# Patient Record
Sex: Female | Born: 1980 | Race: White | Hispanic: No | Marital: Married | Smoking: Never smoker
Health system: Southern US, Community
[De-identification: ages and names within clinical notes are randomized; demographics above are authoritative.]

## PROBLEM LIST (undated history)

## (undated) HISTORY — PX: AUGMENTATION MAMMAPLASTY: SUR837

---
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
## 1999-05-04 ENCOUNTER — Other Ambulatory Visit: Admission: RE | Admit: 1999-05-04 | Discharge: 1999-05-04 | Payer: Self-pay | Admitting: Family Medicine

## 2001-04-29 ENCOUNTER — Other Ambulatory Visit: Admission: RE | Admit: 2001-04-29 | Discharge: 2001-04-29 | Payer: Self-pay | Admitting: Obstetrics and Gynecology

## 2002-11-19 ENCOUNTER — Other Ambulatory Visit: Admission: RE | Admit: 2002-11-19 | Discharge: 2002-11-19 | Payer: Self-pay | Admitting: Obstetrics & Gynecology

## 2006-04-23 ENCOUNTER — Inpatient Hospital Stay (HOSPITAL_COMMUNITY): Admission: AD | Admit: 2006-04-23 | Discharge: 2006-04-24 | Payer: Self-pay | Admitting: Obstetrics and Gynecology

## 2006-04-24 ENCOUNTER — Encounter (INDEPENDENT_AMBULATORY_CARE_PROVIDER_SITE_OTHER): Payer: Self-pay | Admitting: *Deleted

## 2006-04-24 ENCOUNTER — Inpatient Hospital Stay (HOSPITAL_COMMUNITY): Admission: AD | Admit: 2006-04-24 | Discharge: 2006-04-24 | Payer: Self-pay | Admitting: Obstetrics and Gynecology

## 2006-04-24 ENCOUNTER — Inpatient Hospital Stay (HOSPITAL_COMMUNITY): Admission: RE | Admit: 2006-04-24 | Discharge: 2006-04-27 | Payer: Self-pay | Admitting: *Deleted

## 2006-04-29 ENCOUNTER — Inpatient Hospital Stay (HOSPITAL_COMMUNITY): Admission: AD | Admit: 2006-04-29 | Discharge: 2006-04-29 | Payer: Self-pay | Admitting: Obstetrics & Gynecology

## 2006-04-30 ENCOUNTER — Encounter: Admission: RE | Admit: 2006-04-30 | Discharge: 2006-04-30 | Payer: Self-pay | Admitting: Obstetrics and Gynecology

## 2009-09-21 ENCOUNTER — Emergency Department (HOSPITAL_COMMUNITY): Admission: EM | Admit: 2009-09-21 | Discharge: 2009-09-21 | Payer: Self-pay | Admitting: Emergency Medicine

## 2010-06-02 ENCOUNTER — Inpatient Hospital Stay (HOSPITAL_COMMUNITY): Admission: RE | Admit: 2010-06-02 | Discharge: 2010-06-05 | Payer: Self-pay | Admitting: Obstetrics and Gynecology

## 2010-06-04 ENCOUNTER — Ambulatory Visit: Payer: Self-pay | Admitting: Surgery

## 2010-06-04 ENCOUNTER — Encounter (INDEPENDENT_AMBULATORY_CARE_PROVIDER_SITE_OTHER): Payer: Self-pay | Admitting: Obstetrics and Gynecology

## 2010-10-07 ENCOUNTER — Encounter: Payer: Self-pay | Admitting: Family Medicine

## 2010-11-29 LAB — CBC
Hemoglobin: 10.4 g/dL — ABNORMAL LOW (ref 12.0–15.0)
Hemoglobin: 12.4 g/dL (ref 12.0–15.0)
MCH: 32.9 pg (ref 26.0–34.0)
MCH: 33.2 pg (ref 26.0–34.0)
MCHC: 34.4 g/dL (ref 30.0–36.0)
MCHC: 34.9 g/dL (ref 30.0–36.0)
MCV: 95.1 fL (ref 78.0–100.0)
RBC: 3.13 MIL/uL — ABNORMAL LOW (ref 3.87–5.11)
RDW: 13.5 % (ref 11.5–15.5)

## 2010-11-29 LAB — RPR: RPR Ser Ql: NONREACTIVE

## 2011-02-01 NOTE — Discharge Summary (Signed)
NAMESHANDIIN, EISENBEIS             ACCOUNT NO.:  [id number]   MEDICAL RECORD NO.:  [id number]          PATIENT TYPE:  INP   LOCATION:                                FACILITY:  [facility]   PHYSICIAN:  [place] B. Earlene Plater, M.D.  DATE OF BIRTH:  1981/03/19   DATE OF ADMISSION:  04/29/2006  DATE OF DISCHARGE:  04/29/2006                                 DISCHARGE SUMMARY   ADMISSION DIAGNOSIS:  Suspected macrosomia at 39 weeks.   DISCHARGE DIAGNOSIS:  Suspected macrosomia at 39 weeks.   PROCEDURE:  Primary low transverse cesarean section.   HISTORY OF PRESENT ILLNESS:  A 30 year old, white female, gravida 1,  estimated fetal weight of over 10 pounds on ultrasound presents for a  primary C section.   HOSPITAL COURSE:  On the day of surgery, the patient underwent primary low  transverse cesarean section, viable female, Apgar's 9 and 9, thin meconium  noted, weight 10 pounds 12 ounces with nuchal cord x1, pH was 7.24, normal  appearing uterus, tubes and ovaries.   Postpartum the patient rapidly regained the ability to ambulate, void, and  tolerate a regular diet. She was discharged home on the third postoperative  day in satisfactory condition.   DISCHARGE MEDICATIONS:  Tylox 1-2 p.o. q.4-6h p.r.n. pain.   DISCHARGE INSTRUCTIONS:  Standard  instructions give for dismissal .   CONDITION ON DISCHARGE:  Satisfactory.   FOLLOWUP:  6 weeks.      Gerri Spore B. Earlene Plater, M.D.  Electronically Signed     WBD/MEDQ  D:  06/02/2006  T:  06/03/2006  Job:  [id number]

## 2011-02-01 NOTE — Op Note (Signed)
Jodi Howard, Jodi Howard             ACCOUNT NO.:  [id number]   MEDICAL RECORD NO.:  [id number]          PATIENT TYPE:  INP   LOCATION:  9127                          FACILITY:  [facility]   PHYSICIAN:  Gerri Spore B. Earlene Plater, M.D.  DATE OF BIRTH:  Nov 16, 1980   DATE OF PROCEDURE:  04/24/2006  DATE OF DISCHARGE:                                 OPERATIVE REPORT   PREOPERATIVE DIAGNOSES:  1. Term intrauterine pregnancy.  2. Suspected macrosomia.  3. Spontaneous labor.   POSTOPERATIVE DIAGNOSES:  1. Term intrauterine pregnancy.  2. Suspected macrosomia.  3. Spontaneous labor.   PROCEDURE:  Primary low transverse cesarean section.   SURGEON:  Chester Holstein. Earlene Plater, M.D.   ASSISTANT:  Maxie Better, M.D.   ANESTHESIA:  Spinal.   SPECIMENS:  Placenta to pathology.   ESTIMATED BLOOD LOSS:  400 mL.   COMPLICATIONS:  None.   FINDINGS:  A viable female, Apgars 9 and 9.  Thin meconium.  Cord around the  neck x1.  Weight 10 pounds 12 ounces.  Cord pH 7.24.  normal-appearing  uterus, tubes, and ovaries.   INDICATIONS:  Patient with suspected macrosomia on ultrasound yesterday.  Estimated fetal weight was 10 pounds 7 ounces.  The patient was advised of  the inaccuracies of ultrasound-estimated weights, however that there was an  increased risk of macrosomia and the potential for injury and the need for a  C-section.  The patient had decided to proceed with elective C-section later  today but did present in spontaneous labor.  Therefore, her C-section was  expedited.  She was advised preoperatively of the risks of surgery including  infection, bleeding, damage to surrounding organs and potential issues for  subsequent pregnancies having one previous C-section.   PROCEDURE:  The patient was taken to the operating room and spinal  anesthesia obtained.  She was prepped and draped in standard fashion and a  Foley catheter inserted in the bladder.   A Pfannenstiel incision made, carried sharply to the  fascia.  The fascia was  divided sharply, elevated, and the posterior rectus muscles dissected away  sharply.  The posterior sheath and peritoneum were elevated and entered  sharply, bladder blade inserted, bladder flap created sharply.   A uterine incision made in a transverse fashion.  Thin meconium-stained  fluid noted at amniotomy.  The infant's head was elevated to the incision  and with the assistance of a Kiwi placed on the flexion point from the mid-  green zone, one traction was necessary to deliver the vertex, no pop-off's  encountered.  A cord around the neck x1 easily reduced, nose and mouth  suctioned with the DeLee.  The anterior shoulder, which was the left, was  delivered by flexion of the elbow and the remainder of the infant delivered  without difficulty.  The cord was clamped and cut and the infant handed off  to the awaiting pediatricians.  Ancef 1 g was given at cord clamp.   The placenta was removed by uterine massage, the uterus exteriorized and  cleared of all clots and debris.  It was free of extension.  Tubes  and  ovaries and uterus appeared normal.   The uterine incision was closed in a running locked stitch of 0 chromic.  A  second imbricating layer placed.  Hemostasis obtained.   The uterus returned to the abdomen and the pelvis irrigated.  Uterine  incision and bladder flap and subfascial space were all hemostatic.  The  fascia was closed with a running stitch of 0 Vicryl.  The subcutaneous  tissue was irrigated and made hemostatic with the Bovie and reapproximated  with a running stitch of plain 0 gut suture.  The skin was closed with  staples.   The patient tolerated the procedure well with no complications.  She was  taken to the recovery room awake, alert, and in stable condition.  All  counts were correct per the operating room staff.      Gerri Spore B. Earlene Plater, M.D.  Electronically Signed     WBD/MEDQ  D:  04/24/2006  T:  04/24/2006  Job:   [id number]

## 2011-02-27 ENCOUNTER — Other Ambulatory Visit: Payer: Self-pay | Admitting: Family Medicine

## 2011-02-27 DIAGNOSIS — R220 Localized swelling, mass and lump, head: Secondary | ICD-10-CM

## 2011-02-27 DIAGNOSIS — R221 Localized swelling, mass and lump, neck: Secondary | ICD-10-CM

## 2011-02-28 ENCOUNTER — Ambulatory Visit
Admission: RE | Admit: 2011-02-28 | Discharge: 2011-02-28 | Disposition: A | Discharge status: Home / Self Care | Payer: BC Managed Care – PPO | Source: Ambulatory Visit | Attending: Family Medicine | Admitting: Family Medicine

## 2011-02-28 DIAGNOSIS — R220 Localized swelling, mass and lump, head: Secondary | ICD-10-CM

## 2011-03-07 ENCOUNTER — Other Ambulatory Visit: Payer: Self-pay | Admitting: Endocrinology

## 2011-03-07 DIAGNOSIS — E041 Nontoxic single thyroid nodule: Secondary | ICD-10-CM

## 2011-03-12 ENCOUNTER — Other Ambulatory Visit (HOSPITAL_COMMUNITY)
Admission: RE | Admit: 2011-03-12 | Discharge: 2011-03-12 | Disposition: A | Discharge status: Home / Self Care | Payer: BC Managed Care – PPO | Source: Ambulatory Visit | Attending: Interventional Radiology | Admitting: Interventional Radiology

## 2011-03-12 ENCOUNTER — Ambulatory Visit
Admission: RE | Admit: 2011-03-12 | Discharge: 2011-03-12 | Disposition: A | Discharge status: Home / Self Care | Payer: BC Managed Care – PPO | Source: Ambulatory Visit | Attending: Endocrinology | Admitting: Endocrinology

## 2011-03-12 ENCOUNTER — Other Ambulatory Visit: Payer: Self-pay | Admitting: Interventional Radiology

## 2011-03-12 DIAGNOSIS — E041 Nontoxic single thyroid nodule: Secondary | ICD-10-CM

## 2011-03-12 DIAGNOSIS — E049 Nontoxic goiter, unspecified: Secondary | ICD-10-CM | POA: Insufficient documentation

## 2012-09-10 ENCOUNTER — Emergency Department (HOSPITAL_COMMUNITY)
Admission: EM | Admit: 2012-09-10 | Discharge: 2012-09-11 | Disposition: A | Discharge status: Home / Self Care | Payer: BC Managed Care – PPO | Attending: Emergency Medicine | Admitting: Emergency Medicine

## 2012-09-10 ENCOUNTER — Encounter (HOSPITAL_COMMUNITY): Payer: Self-pay | Admitting: *Deleted

## 2012-09-10 DIAGNOSIS — G510 Bell's palsy: Secondary | ICD-10-CM

## 2012-09-10 NOTE — ED Notes (Signed)
Informed P Dammen PA of patients total time in [facility] without being seen by MD or PA

## 2012-09-10 NOTE — ED Notes (Signed)
Pt c/o dry mouth all day; tonight felt like had had dental work; left side of mouth felt numb; minimal deficit noted to left side of face/eyebrow/mouth; no other c/o distress

## 2012-09-10 NOTE — ED Notes (Signed)
Pt sts she awoke this morning with left sided facial numbness and drooping. Pt mother concerned for bell's palsey. Pt has no other deficits other than numbness in the left side of her face, able to speak clear sentences.

## 2012-09-11 MED ORDER — VALACYCLOVIR HCL 1 G PO TABS: 1000.0000 mg | ORAL_TABLET | Freq: Three times a day (TID) | ORAL | Status: DC

## 2012-09-11 MED ORDER — PREDNISONE 10 MG PO TABS: ORAL_TABLET | ORAL | Status: DC

## 2012-09-11 MED ORDER — PREDNISONE 20 MG PO TABS
ORAL_TABLET | ORAL | Status: AC
  Filled 2012-09-11: qty 2

## 2012-09-11 MED ORDER — VALACYCLOVIR HCL 500 MG PO TABS
1000.0000 mg | ORAL_TABLET | Freq: Once | ORAL | Status: AC
  Administered 2012-09-11: 1000 mg via ORAL
  Filled 2012-09-11: qty 2

## 2012-09-11 MED ORDER — PREDNISONE 20 MG PO TABS
60.0000 mg | ORAL_TABLET | Freq: Once | ORAL | Status: AC
  Administered 2012-09-11: 60 mg via ORAL
  Filled 2012-09-11: qty 1

## 2012-09-11 NOTE — ED Provider Notes (Signed)
History     CSN: [id number]  Arrival date & time 09/10/12  2045   First MD Initiated Contact with Patient 09/10/12 2302      No chief complaint on file.   (Consider location/radiation/quality/duration/timing/severity/associated sxs/prior treatment) HPI  Patient reports that she woke up this morning she felt like it was on it was dry and irritated and she also noted she had a dry mouth. She states later during the day she noted when she was eating her left face felt numb this evening. She denies any drooling or difficulty holding fluids or food in her mouth. She denies any ear pain. She states she does feel like her taste is off and not right. She's had a dull headache in her left forehead off and on during the day. She denies any numbness or tingling in her extremities. Patient reports she's been under a lot of stress recently with Christmas and her mother-in-law staying with them.  Patient's mother reports there are at least 8 family members who have all had Bell's palsy. She reports all recovered with no long-standing deficit except for a 32 year old who had had previous strokes.  PCP Dr. Catha Gosselin  History reviewed. No pertinent past medical history.  History reviewed. No pertinent past surgical history.  No family history on file.  History  Substance Use Topics  . Smoking status: Never Smoker   . Smokeless tobacco: Not on file  . Alcohol Use: No   stay-at-home mom  OB History    Grav Para Term Preterm Abortions TAB SAB Ect Mult Living                  Review of Systems  All other systems reviewed and are negative.    Allergies  Review of patient's allergies indicates no known allergies.  Home Medications     BP 131/75  Pulse 67  Temp 97.5 F (36.4 C) (Oral)  Resp 18  SpO2 100%  Vital signs normal    Physical Exam  Nursing note and vitals reviewed. Constitutional: She is oriented to person, place, and time. She appears well-developed and  well-nourished.  Non-toxic appearance. She does not appear ill. No distress.  HENT:  Head: Normocephalic and atraumatic.  Right Ear: External ear normal.  Left Ear: External ear normal.  Nose: Nose normal. No mucosal edema or rhinorrhea.  Mouth/Throat: Oropharynx is clear and moist and mucous membranes are normal. No dental abscesses or uvula swelling.  Eyes: Conjunctivae normal and EOM are normal. Pupils are equal, round, and reactive to light.  Neck: Normal range of motion and full passive range of motion without pain. Neck supple.  Pulmonary/Chest: Effort normal and breath sounds normal. No respiratory distress. She has no rhonchi. She exhibits no crepitus.  Abdominal: Normal appearance.  Musculoskeletal: Normal range of motion. She exhibits no edema and no tenderness.       Moves all extremities well.   Neurological: She is alert and oriented to person, place, and time. She has normal strength. No cranial nerve deficit.       Patient has equal grips. Patient has mild loss of range of motion of her left eyebrow. Although she can close her left eye I can easily open her eyelid without resistance when she's trying to keep it closed. The right is normal. When the patient smiles she is noted to have a mild left facial droop.  Skin: Skin is warm, dry and intact. No rash noted. No erythema. No pallor.  Psychiatric: She  has a normal mood and affect. Her speech is normal and behavior is normal. Her mood appears not anxious.    ED Course  Procedures (including critical care time)   Medications  valACYclovir (VALTREX) tablet 1,000 mg (not administered)  predniSONE (DELTASONE) tablet 60 mg (not administered)    We have discussed trying to keep her left are moist and such as using artificial tears and Lacri-Lube in her eye at night and keeping her eye lid shut using eye patches.   1. Bell's palsy    New Prescriptions   PREDNISONE (DELTASONE) 10 MG TABLET    Take 3 po QD x 3d starting  tomorrow, then 2 po QD x 4d then 1 po QD x 4d   VALACYCLOVIR (VALTREX) 1000 MG TABLET    Take 1 tablet (1,000 mg total) by mouth 3 (three) times daily.    Plan discharge  Devoria Albe, MD, FACEP    MDM          Ward Givens, MD 09/11/12 [phone]

## 2012-09-11 NOTE — ED Notes (Signed)
Patient given discharge instructions, information, prescriptions, and diet order. Patient states that they adequately understand discharge information given and to return to ED if symptoms return or worsen.     

## 2012-09-24 ENCOUNTER — Other Ambulatory Visit: Payer: Self-pay | Admitting: Otolaryngology

## 2012-09-24 DIAGNOSIS — G510 Bell's palsy: Secondary | ICD-10-CM

## 2012-09-30 ENCOUNTER — Other Ambulatory Visit: Payer: BC Managed Care – PPO

## 2013-01-05 ENCOUNTER — Ambulatory Visit (INDEPENDENT_AMBULATORY_CARE_PROVIDER_SITE_OTHER): Payer: Self-pay | Admitting: Surgery

## 2013-02-09 ENCOUNTER — Encounter (INDEPENDENT_AMBULATORY_CARE_PROVIDER_SITE_OTHER): Payer: Self-pay | Admitting: Surgery

## 2013-02-09 ENCOUNTER — Ambulatory Visit (INDEPENDENT_AMBULATORY_CARE_PROVIDER_SITE_OTHER): Payer: BC Managed Care – PPO | Admitting: Surgery

## 2013-02-09 VITALS — BP 100/64 | HR 72 | Resp 18 | Ht 69.0 in | Wt 176.0 lb

## 2013-02-09 DIAGNOSIS — R1032 Left lower quadrant pain: Secondary | ICD-10-CM

## 2013-02-09 NOTE — Progress Notes (Signed)
NAME: Jodi Howard       DOB: 08/16/1981           DATE: 02/09/2013       ZOX:[id number]  CC:  Chief Complaint  Patient presents with  . Other    Eval ?abd. hernia    HPI: We are asked to see her by her gynecologist to evaluate for possible left lower quadrant hernia. She lost about 50 pounds well over a year ago and has noticed some asymmetry between the left and right side of her abdomen. She is really feel a bulge coming out and doesn't have any pain when she lifts but is a little unusual looking to her. She has no other GI symptoms.  Past medical history family history are all contained the electronic medical record and not placed in this note but has been reviewed EXAM: Vital signs: BP 100/64  Pulse 72  Resp 18  Ht 5\' 9"  (1.753 m)  Wt 176 lb (79.833 kg)  BMI 25.98 kg/m2  General: Patient alert, oriented, NAD  Abdomen: There is loose scan suggestive of weight loss. There are no masses and no tenderness. There is no organomegaly. In the left lower quadrant near the area where a spiculated hernia would be noted was some prominent fatty tissue. However I could not detect a definite hernia with her supine or standing or straining. IMP: I think she just has some asymmetric fat from her weight loss.  PLAN: we discussed alternatives. I think it would be better just to wait to see if symptoms develop or more of an obvious bulge occurs. a CT scan would be definitive to rule out a hernia clinically she is asymptomatic and there is not a palpable mass  Yemaya Barnier J 02/09/2013

## 2013-02-09 NOTE — Patient Instructions (Signed)
If you develop more of a bulge or pain come back and let me re-evaluate you

## 2013-11-05 ENCOUNTER — Other Ambulatory Visit: Payer: Self-pay | Admitting: Family Medicine

## 2013-11-05 DIAGNOSIS — E049 Nontoxic goiter, unspecified: Secondary | ICD-10-CM

## 2013-11-11 ENCOUNTER — Other Ambulatory Visit: Payer: BC Managed Care – PPO

## 2013-11-15 ENCOUNTER — Ambulatory Visit
Admission: RE | Admit: 2013-11-15 | Discharge: 2013-11-15 | Disposition: A | Discharge status: Home / Self Care | Payer: BC Managed Care – PPO | Source: Ambulatory Visit | Attending: Family Medicine | Admitting: Family Medicine

## 2013-11-15 DIAGNOSIS — E049 Nontoxic goiter, unspecified: Secondary | ICD-10-CM

## 2014-03-10 ENCOUNTER — Ambulatory Visit: Payer: BC Managed Care – PPO | Admitting: Family Medicine

## 2014-03-10 ENCOUNTER — Telehealth: Payer: Self-pay | Admitting: Family Medicine

## 2014-03-10 DIAGNOSIS — Z0289 Encounter for other administrative examinations: Secondary | ICD-10-CM

## 2014-03-10 NOTE — Telephone Encounter (Signed)
Patient now showed for new patient appt today.  As of right now there is no other appt scheduled with Dr. Katrinka BlazingSmith.  Please advise.  Thanks!

## 2014-03-10 NOTE — Telephone Encounter (Signed)
Noted  

## 2021-01-31 ENCOUNTER — Other Ambulatory Visit: Payer: Self-pay | Admitting: Obstetrics and Gynecology

## 2021-01-31 DIAGNOSIS — Z1231 Encounter for screening mammogram for malignant neoplasm of breast: Secondary | ICD-10-CM

## 2021-02-13 ENCOUNTER — Ambulatory Visit: Payer: Self-pay

## 2021-02-14 ENCOUNTER — Other Ambulatory Visit: Payer: Self-pay

## 2021-02-14 ENCOUNTER — Ambulatory Visit
Admission: RE | Admit: 2021-02-14 | Discharge: 2021-02-14 | Disposition: A | Discharge status: Home / Self Care | Payer: BC Managed Care – PPO | Source: Ambulatory Visit | Attending: Obstetrics and Gynecology | Admitting: Obstetrics and Gynecology

## 2021-02-14 DIAGNOSIS — Z1231 Encounter for screening mammogram for malignant neoplasm of breast: Secondary | ICD-10-CM

## 2021-04-12 ENCOUNTER — Ambulatory Visit: Payer: Self-pay

## 2021-08-11 ENCOUNTER — Emergency Department (HOSPITAL_COMMUNITY): Payer: BC Managed Care – PPO | Admitting: Certified Registered Nurse Anesthetist

## 2021-08-11 ENCOUNTER — Emergency Department (HOSPITAL_BASED_OUTPATIENT_CLINIC_OR_DEPARTMENT_OTHER): Payer: BC Managed Care – PPO

## 2021-08-11 ENCOUNTER — Encounter (HOSPITAL_BASED_OUTPATIENT_CLINIC_OR_DEPARTMENT_OTHER): Payer: Self-pay | Admitting: *Deleted

## 2021-08-11 ENCOUNTER — Other Ambulatory Visit: Payer: Self-pay

## 2021-08-11 ENCOUNTER — Encounter (HOSPITAL_COMMUNITY)
Admission: EM | Disposition: A | Discharge status: Home / Self Care | Payer: Self-pay | Source: Home / Self Care | Attending: Emergency Medicine

## 2021-08-11 ENCOUNTER — Ambulatory Visit (HOSPITAL_BASED_OUTPATIENT_CLINIC_OR_DEPARTMENT_OTHER)
Admission: EM | Admit: 2021-08-11 | Discharge: 2021-08-11 | Disposition: A | Discharge status: Home / Self Care | Payer: BC Managed Care – PPO | Attending: Emergency Medicine | Admitting: Emergency Medicine

## 2021-08-11 DIAGNOSIS — K353 Acute appendicitis with localized peritonitis, without perforation or gangrene: Secondary | ICD-10-CM

## 2021-08-11 DIAGNOSIS — K3580 Unspecified acute appendicitis: Secondary | ICD-10-CM | POA: Insufficient documentation

## 2021-08-11 DIAGNOSIS — Z20822 Contact with and (suspected) exposure to covid-19: Secondary | ICD-10-CM | POA: Diagnosis not present

## 2021-08-11 DIAGNOSIS — K3533 Acute appendicitis with perforation and localized peritonitis, with abscess: Secondary | ICD-10-CM | POA: Diagnosis present

## 2021-08-11 DIAGNOSIS — R748 Abnormal levels of other serum enzymes: Secondary | ICD-10-CM

## 2021-08-11 HISTORY — PX: LAPAROSCOPIC APPENDECTOMY: SHX408

## 2021-08-11 LAB — COMPREHENSIVE METABOLIC PANEL
ALT: 9 U/L (ref 0–44)
AST: 15 U/L (ref 15–41)
Albumin: 3.9 g/dL (ref 3.5–5.0)
Alkaline Phosphatase: 49 U/L (ref 38–126)
Anion gap: 7 (ref 5–15)
BUN: 11 mg/dL (ref 6–20)
CO2: 26 mmol/L (ref 22–32)
Calcium: 9.3 mg/dL (ref 8.9–10.3)
Chloride: 103 mmol/L (ref 98–111)
Creatinine, Ser: 0.68 mg/dL (ref 0.44–1.00)
GFR, Estimated: >60 mL/min (ref >60)
Glucose, Bld: 129 mg/dL — ABNORMAL HIGH (ref 70–99)
Potassium: 4.2 mmol/L (ref 3.5–5.1)
Sodium: 136 mmol/L (ref 135–145)
Total Bilirubin: 0.7 mg/dL (ref 0.3–1.2)
Total Protein: 5.9 g/dL — ABNORMAL LOW (ref 6.5–8.1)

## 2021-08-11 LAB — CBC WITH DIFFERENTIAL/PLATELET
Abs Immature Granulocytes: 0.02 10*3/uL (ref 0.00–0.07)
Basophils Absolute: 0 10*3/uL (ref 0.0–0.1)
Basophils Relative: 0 %
Eosinophils Absolute: 0.1 10*3/uL (ref 0.0–0.5)
Eosinophils Relative: 1 %
HCT: 37.6 % (ref 36.0–46.0)
Hemoglobin: 12.5 g/dL (ref 12.0–15.0)
Immature Granulocytes: 0 %
Lymphocytes Relative: 19 %
Lymphs Abs: 1.7 10*3/uL (ref 0.7–4.0)
MCH: 31.1 pg (ref 26.0–34.0)
MCHC: 33.2 g/dL (ref 30.0–36.0)
MCV: 93.5 fL (ref 80.0–100.0)
Monocytes Absolute: 0.7 10*3/uL (ref 0.1–1.0)
Monocytes Relative: 8 %
Neutro Abs: 6.3 10*3/uL (ref 1.7–7.7)
Neutrophils Relative %: 72 %
Platelets: 220 10*3/uL (ref 150–400)
RBC: 4.02 MIL/uL (ref 3.87–5.11)
RDW: 12.5 % (ref 11.5–15.5)
WBC: 8.8 10*3/uL (ref 4.0–10.5)
nRBC: 0 % (ref 0.0–0.2)

## 2021-08-11 LAB — URINALYSIS, ROUTINE W REFLEX MICROSCOPIC
Bilirubin Urine: NEGATIVE
Glucose, UA: NEGATIVE mg/dL
Ketones, ur: NEGATIVE mg/dL
Nitrite: NEGATIVE
Protein, ur: 30 mg/dL — AB
RBC / HPF: >50 RBC/hpf — ABNORMAL HIGH (ref 0–5)
Specific Gravity, Urine: >1.046 — ABNORMAL HIGH (ref 1.005–1.030)
WBC, UA: >50 WBC/hpf — ABNORMAL HIGH (ref 0–5)
pH: 7 (ref 5.0–8.0)

## 2021-08-11 LAB — LIPASE, BLOOD: Lipase: 337 U/L — ABNORMAL HIGH (ref 11–51)

## 2021-08-11 LAB — RESP PANEL BY RT-PCR (FLU A&B, COVID) ARPGX2
Influenza A by PCR: NEGATIVE
Influenza B by PCR: NEGATIVE
SARS Coronavirus 2 by RT PCR: NEGATIVE

## 2021-08-11 LAB — HCG, SERUM, QUALITATIVE: Preg, Serum: NEGATIVE

## 2021-08-11 SURGERY — APPENDECTOMY, LAPAROSCOPIC
Anesthesia: General

## 2021-08-11 MED ORDER — FENTANYL CITRATE PF 50 MCG/ML IJ SOSY
100.0000 ug | PREFILLED_SYRINGE | Freq: Once | INTRAMUSCULAR | Status: AC
  Administered 2021-08-11: 100 ug via INTRAVENOUS
  Filled 2021-08-11 (×2): qty 2

## 2021-08-11 MED ORDER — ROCURONIUM BROMIDE 10 MG/ML (PF) SYRINGE
PREFILLED_SYRINGE | INTRAVENOUS | Status: DC | PRN
  Administered 2021-08-11: 30 mg via INTRAVENOUS
  Administered 2021-08-11: 20 mg via INTRAVENOUS

## 2021-08-11 MED ORDER — MIDAZOLAM HCL 2 MG/2ML IJ SOLN
INTRAMUSCULAR | Status: AC
  Filled 2021-08-11: qty 2

## 2021-08-11 MED ORDER — CHLORHEXIDINE GLUCONATE 0.12 % MT SOLN
OROMUCOSAL | Status: AC
  Filled 2021-08-11: qty 15

## 2021-08-11 MED ORDER — IOHEXOL 300 MG/ML  SOLN
100.0000 mL | Freq: Once | INTRAMUSCULAR | Status: AC | PRN
  Administered 2021-08-11: 100 mL via INTRAVENOUS

## 2021-08-11 MED ORDER — MIDAZOLAM HCL 2 MG/2ML IJ SOLN
INTRAMUSCULAR | Status: DC | PRN
  Administered 2021-08-11: 2 mg via INTRAVENOUS

## 2021-08-11 MED ORDER — SODIUM CHLORIDE 0.9 % IV SOLN
2.0000 g | Freq: Once | INTRAVENOUS | Status: AC
  Administered 2021-08-11: 2 g via INTRAVENOUS
  Filled 2021-08-11: qty 20

## 2021-08-11 MED ORDER — HYDROMORPHONE HCL 1 MG/ML IJ SOLN
0.2500 mg | INTRAMUSCULAR | Status: DC | PRN
Start: 2021-08-11 — End: 2021-08-11
  Administered 2021-08-11 (×2): 0.5 mg via INTRAVENOUS

## 2021-08-11 MED ORDER — OXYCODONE HCL 5 MG PO TABS: 5.0000 mg | ORAL_TABLET | Freq: Four times a day (QID) | ORAL | 0 refills | Status: DC | PRN

## 2021-08-11 MED ORDER — LIDOCAINE 2% (20 MG/ML) 5 ML SYRINGE
INTRAMUSCULAR | Status: DC | PRN
  Administered 2021-08-11: 60 mg via INTRAVENOUS

## 2021-08-11 MED ORDER — LACTATED RINGERS IV SOLN: INTRAVENOUS | Status: DC

## 2021-08-11 MED ORDER — SCOPOLAMINE 1 MG/3DAYS TD PT72
1.0000 | MEDICATED_PATCH | TRANSDERMAL | Status: DC
  Administered 2021-08-11: 1.5 mg via TRANSDERMAL
  Filled 2021-08-11: qty 1

## 2021-08-11 MED ORDER — PROPOFOL 10 MG/ML IV BOLUS
INTRAVENOUS | Status: AC
  Filled 2021-08-11: qty 20

## 2021-08-11 MED ORDER — ACETAMINOPHEN 10 MG/ML IV SOLN
INTRAVENOUS | Status: AC
  Filled 2021-08-11: qty 100

## 2021-08-11 MED ORDER — FENTANYL CITRATE (PF) 250 MCG/5ML IJ SOLN
INTRAMUSCULAR | Status: DC | PRN
  Administered 2021-08-11 (×3): 50 ug via INTRAVENOUS

## 2021-08-11 MED ORDER — SUCCINYLCHOLINE CHLORIDE 200 MG/10ML IV SOSY
PREFILLED_SYRINGE | INTRAVENOUS | Status: DC | PRN
  Administered 2021-08-11: 100 mg via INTRAVENOUS

## 2021-08-11 MED ORDER — ONDANSETRON HCL 4 MG/2ML IJ SOLN
4.0000 mg | Freq: Once | INTRAMUSCULAR | Status: AC
  Administered 2021-08-11: 4 mg via INTRAVENOUS
  Filled 2021-08-11: qty 2

## 2021-08-11 MED ORDER — FENTANYL CITRATE (PF) 250 MCG/5ML IJ SOLN
INTRAMUSCULAR | Status: AC
  Filled 2021-08-11: qty 5

## 2021-08-11 MED ORDER — BUPIVACAINE-EPINEPHRINE 0.25% -1:200000 IJ SOLN
INTRAMUSCULAR | Status: DC | PRN
  Administered 2021-08-11: 30 mL

## 2021-08-11 MED ORDER — IBUPROFEN 800 MG PO TABS: 800.0000 mg | ORAL_TABLET | Freq: Three times a day (TID) | ORAL | 0 refills | Status: AC | PRN

## 2021-08-11 MED ORDER — BUPIVACAINE-EPINEPHRINE (PF) 0.25% -1:200000 IJ SOLN
INTRAMUSCULAR | Status: AC
  Filled 2021-08-11: qty 30

## 2021-08-11 MED ORDER — ONDANSETRON HCL 4 MG/2ML IJ SOLN
INTRAMUSCULAR | Status: DC | PRN
  Administered 2021-08-11: 4 mg via INTRAVENOUS

## 2021-08-11 MED ORDER — SODIUM CHLORIDE 0.9 % IR SOLN
Status: DC | PRN
  Administered 2021-08-11: 1000 mL

## 2021-08-11 MED ORDER — FENTANYL CITRATE PF 50 MCG/ML IJ SOSY
50.0000 ug | PREFILLED_SYRINGE | INTRAMUSCULAR | Status: DC | PRN
  Administered 2021-08-11: 50 ug via INTRAVENOUS
  Filled 2021-08-11: qty 1

## 2021-08-11 MED ORDER — HYDROMORPHONE HCL 1 MG/ML IJ SOLN
INTRAMUSCULAR | Status: AC
  Filled 2021-08-11: qty 1

## 2021-08-11 MED ORDER — CHLORHEXIDINE GLUCONATE 0.12 % MT SOLN
15.0000 mL | Freq: Once | OROMUCOSAL | Status: AC
  Administered 2021-08-11: 15 mL via OROMUCOSAL

## 2021-08-11 MED ORDER — OXYCODONE HCL 5 MG PO TABS: 5.0000 mg | ORAL_TABLET | Freq: Four times a day (QID) | ORAL | 0 refills | Status: AC | PRN

## 2021-08-11 MED ORDER — 0.9 % SODIUM CHLORIDE (POUR BTL) OPTIME
TOPICAL | Status: DC | PRN
  Administered 2021-08-11: 1000 mL

## 2021-08-11 MED ORDER — METRONIDAZOLE 500 MG/100ML IV SOLN
500.0000 mg | Freq: Once | INTRAVENOUS | Status: AC
  Administered 2021-08-11: 500 mg via INTRAVENOUS
  Filled 2021-08-11: qty 100

## 2021-08-11 MED ORDER — FENTANYL CITRATE PF 50 MCG/ML IJ SOSY
50.0000 ug | PREFILLED_SYRINGE | Freq: Once | INTRAMUSCULAR | Status: AC
  Administered 2021-08-11: 50 ug via INTRAVENOUS
  Filled 2021-08-11: qty 1

## 2021-08-11 MED ORDER — DEXAMETHASONE SODIUM PHOSPHATE 10 MG/ML IJ SOLN
INTRAMUSCULAR | Status: DC | PRN
  Administered 2021-08-11: 10 mg via INTRAVENOUS

## 2021-08-11 MED ORDER — KETOROLAC TROMETHAMINE 30 MG/ML IJ SOLN
INTRAMUSCULAR | Status: DC | PRN
  Administered 2021-08-11: 30 mg via INTRAVENOUS

## 2021-08-11 MED ORDER — SUGAMMADEX SODIUM 200 MG/2ML IV SOLN
INTRAVENOUS | Status: DC | PRN
  Administered 2021-08-11: 200 mg via INTRAVENOUS

## 2021-08-11 MED ORDER — ACETAMINOPHEN 10 MG/ML IV SOLN
INTRAVENOUS | Status: DC | PRN
  Administered 2021-08-11: 1000 mg via INTRAVENOUS

## 2021-08-11 MED ORDER — PROPOFOL 10 MG/ML IV BOLUS
INTRAVENOUS | Status: DC | PRN
  Administered 2021-08-11: 120 mg via INTRAVENOUS

## 2021-08-11 MED ORDER — SCOPOLAMINE 1 MG/3DAYS TD PT72
MEDICATED_PATCH | TRANSDERMAL | Status: AC
  Filled 2021-08-11: qty 1

## 2021-08-11 MED ORDER — IBUPROFEN 800 MG PO TABS: 800.0000 mg | ORAL_TABLET | Freq: Three times a day (TID) | ORAL | 0 refills | Status: DC | PRN

## 2021-08-11 SURGICAL SUPPLY — 43 items
APPLIER CLIP 5 13 M/L LIGAMAX5 (MISCELLANEOUS)
BAG COUNTER SPONGE SURGICOUNT (BAG) ×2 IMPLANT
BAG SURGICOUNT SPONGE COUNTING (BAG) ×1
CANISTER SUCT 3000ML PPV (MISCELLANEOUS) ×3 IMPLANT
CHLORAPREP W/TINT 26 (MISCELLANEOUS) ×3 IMPLANT
CLIP APPLIE 5 13 M/L LIGAMAX5 (MISCELLANEOUS) IMPLANT
CLIP LIGATING HEMO LOK XL GOLD (MISCELLANEOUS) ×3 IMPLANT
COVER SURGICAL LIGHT HANDLE (MISCELLANEOUS) ×3 IMPLANT
CUTTER FLEX LINEAR 45M (STAPLE) IMPLANT
DERMABOND ADVANCED (GAUZE/BANDAGES/DRESSINGS) ×2
DERMABOND ADVANCED .7 DNX12 (GAUZE/BANDAGES/DRESSINGS) ×1 IMPLANT
ELECT REM PT RETURN 9FT ADLT (ELECTROSURGICAL) ×3
ELECTRODE REM PT RTRN 9FT ADLT (ELECTROSURGICAL) ×1 IMPLANT
ENDOLOOP SUT PDS II  0 18 (SUTURE)
ENDOLOOP SUT PDS II 0 18 (SUTURE) IMPLANT
GLOVE SURG POLYISO LF SZ7 (GLOVE) ×3 IMPLANT
GLOVE SURG UNDER POLY LF SZ7 (GLOVE) ×3 IMPLANT
GOWN STRL REUS W/ TWL LRG LVL3 (GOWN DISPOSABLE) ×3 IMPLANT
GOWN STRL REUS W/TWL LRG LVL3 (GOWN DISPOSABLE) ×9
GRASPER SUT TROCAR 14GX15 (MISCELLANEOUS) ×3 IMPLANT
KIT BASIN OR (CUSTOM PROCEDURE TRAY) ×3 IMPLANT
KIT TURNOVER KIT B (KITS) ×3 IMPLANT
NEEDLE 22X1 1/2 (OR ONLY) (NEEDLE) ×3 IMPLANT
NS IRRIG 1000ML POUR BTL (IV SOLUTION) ×3 IMPLANT
PAD ARMBOARD 7.5X6 YLW CONV (MISCELLANEOUS) ×6 IMPLANT
POUCH RETRIEVAL ECOSAC 10 (ENDOMECHANICALS) ×1 IMPLANT
POUCH RETRIEVAL ECOSAC 10MM (ENDOMECHANICALS) ×3
RELOAD STAPLE TA45 3.5 REG BLU (ENDOMECHANICALS) IMPLANT
SCISSORS LAP 5X35 DISP (ENDOMECHANICALS) ×3 IMPLANT
SET IRRIG TUBING LAPAROSCOPIC (IRRIGATION / IRRIGATOR) ×3 IMPLANT
SET TUBE SMOKE EVAC HIGH FLOW (TUBING) ×3 IMPLANT
SLEEVE ENDOPATH XCEL 5M (ENDOMECHANICALS) ×3 IMPLANT
SOL ANTI FOG 6CC (MISCELLANEOUS) ×1 IMPLANT
SOLUTION ANTI FOG 6CC (MISCELLANEOUS) ×2
SPECIMEN JAR SMALL (MISCELLANEOUS) ×3 IMPLANT
SUT MNCRL AB 4-0 PS2 18 (SUTURE) ×3 IMPLANT
TOWEL GREEN STERILE (TOWEL DISPOSABLE) ×3 IMPLANT
TOWEL GREEN STERILE FF (TOWEL DISPOSABLE) ×3 IMPLANT
TRAY FOLEY W/BAG SLVR 14FR (SET/KITS/TRAYS/PACK) IMPLANT
TRAY LAPAROSCOPIC MC (CUSTOM PROCEDURE TRAY) ×3 IMPLANT
TROCAR XCEL 12X100 BLDLESS (ENDOMECHANICALS) ×3 IMPLANT
TROCAR XCEL NON-BLD 5MMX100MML (ENDOMECHANICALS) ×3 IMPLANT
WATER STERILE IRR 1000ML POUR (IV SOLUTION) ×3 IMPLANT

## 2021-08-11 NOTE — Transfer of Care (Signed)
Immediate Anesthesia Transfer of Care Note  Patient: Jodi Howard  Procedure(s) Performed: APPENDECTOMY LAPAROSCOPIC  Patient Location: PACU  Anesthesia Type:General  Level of Consciousness: drowsy  Airway & Oxygen Therapy: Patient Spontanous Breathing  Post-op Assessment: Report given to RN and Post -op Vital signs reviewed and stable  Post vital signs: Reviewed and stable  Last Vitals:  Vitals Value Taken Time  BP 130/65 08/11/21 1054  Temp    Pulse 78 08/11/21 1054  Resp 26 08/11/21 1054  SpO2 97 % 08/11/21 1054  Vitals shown include unvalidated device data.  Last Pain:  Vitals:   08/11/21 0911  TempSrc: Oral  PainSc:          Complications: No notable events documented.

## 2021-08-11 NOTE — ED Provider Notes (Signed)
Care assumed from Dr. Read Drivers following transfer from [facility] for acute appendicitis.  See his note for full HPI.  Patient is a 40 year old female who presents to the ED due to right lower quadrant abdominal pain that started yesterday around 8 PM.  Patient had CT scan at droppage and found to have acute appendicitis.  General surgery was consulted and recommended transfer to [facility].  IV antibiotics given prior to transport.  Patient states her pain is roughly a 5/10.  Last ate around 11:30 PM a chocolate strawberry.  Last full meal was around 8 PM yesterday. Physical Exam  BP 119/69   Pulse (!) 52   Temp 98.6 F (37 C) (Oral)   Resp 16   Ht 5\' 8"  (1.727 m)   Wt 73 kg   LMP 07/21/2021   SpO2 98%   BMI 24.48 kg/m   Physical Exam Vitals and nursing note reviewed.  Constitutional:      General: She is not in acute distress.    Appearance: She is not ill-appearing.  HENT:     Head: Normocephalic.  Eyes:     Pupils: Pupils are equal, round, and reactive to light.  Cardiovascular:     Rate and Rhythm: Normal rate and regular rhythm.     Pulses: Normal pulses.     Heart sounds: Normal heart sounds. No murmur heard.   No friction rub. No gallop.  Pulmonary:     Effort: Pulmonary effort is normal.     Breath sounds: Normal breath sounds.  Abdominal:     General: Abdomen is flat. There is no distension.     Palpations: Abdomen is soft.     Tenderness: There is abdominal tenderness. There is guarding. There is no rebound.     Comments: +tenderness in RLQ  Musculoskeletal:        General: Normal range of motion.     Cervical back: Neck supple.  Skin:    General: Skin is warm and dry.  Neurological:     General: No focal deficit present.     Mental Status: She is alert.  Psychiatric:        Mood and Affect: Mood normal.        Behavior: Behavior normal.    ED Course/Procedures    Results for orders placed or performed during the hospital encounter of 08/11/21 (from the  past 24 hour(s))  Lipase, blood     Status: Abnormal   Collection Time: 08/11/21  3:43 AM  Result Value Ref Range   Lipase 337 (H) 11 - 51 U/L  Comprehensive metabolic panel     Status: Abnormal   Collection Time: 08/11/21  3:43 AM  Result Value Ref Range   Sodium 136 135 - 145 mmol/L   Potassium 4.2 3.5 - 5.1 mmol/L   Chloride 103 98 - 111 mmol/L   CO2 26 22 - 32 mmol/L   Glucose, Bld 129 (H) 70 - 99 mg/dL   BUN 11 6 - 20 mg/dL   Creatinine, Ser 08/13/21 0.44 - 1.00 mg/dL   Calcium 9.3 8.9 - 3.33 mg/dL   Total Protein 5.9 (L) 6.5 - 8.1 g/dL   Albumin 3.9 3.5 - 5.0 g/dL   AST 15 15 - 41 U/L   ALT 9 0 - 44 U/L   Alkaline Phosphatase 49 38 - 126 U/L   Total Bilirubin 0.7 0.3 - 1.2 mg/dL   GFR, Estimated 83.2 >91 mL/min   Anion gap 7 5 - 15  CBC with Differential/Platelet     Status: None   Collection Time: 08/11/21  3:43 AM  Result Value Ref Range   WBC 8.8 4.0 - 10.5 K/uL   RBC 4.02 3.87 - 5.11 MIL/uL   Hemoglobin 12.5 12.0 - 15.0 g/dL   HCT 27.7 82.4 - 23.5 %   MCV 93.5 80.0 - 100.0 fL   MCH 31.1 26.0 - 34.0 pg   MCHC 33.2 30.0 - 36.0 g/dL   RDW 36.1 44.3 - 15.4 %   Platelets 220 150 - 400 K/uL   nRBC 0.0 0.0 - 0.2 %   Neutrophils Relative % 72 %   Neutro Abs 6.3 1.7 - 7.7 K/uL   Lymphocytes Relative 19 %   Lymphs Abs 1.7 0.7 - 4.0 K/uL   Monocytes Relative 8 %   Monocytes Absolute 0.7 0.1 - 1.0 K/uL   Eosinophils Relative 1 %   Eosinophils Absolute 0.1 0.0 - 0.5 K/uL   Basophils Relative 0 %   Basophils Absolute 0.0 0.0 - 0.1 K/uL   Immature Granulocytes 0 %   Abs Immature Granulocytes 0.02 0.00 - 0.07 K/uL  hCG, serum, qualitative     Status: None   Collection Time: 08/11/21  4:21 AM  Result Value Ref Range   Preg, Serum NEGATIVE NEGATIVE  Resp Panel by RT-PCR (Flu A&B, Covid) Nasopharyngeal Swab     Status: None   Collection Time: 08/11/21  5:51 AM   Specimen: Nasopharyngeal Swab; Nasopharyngeal(NP) swabs in vial transport medium  Result Value Ref Range   SARS  Coronavirus 2 by RT PCR NEGATIVE NEGATIVE   Influenza A by PCR NEGATIVE NEGATIVE   Influenza B by PCR NEGATIVE NEGATIVE  Urinalysis, Routine w reflex microscopic     Status: Abnormal   Collection Time: 08/11/21  6:00 AM  Result Value Ref Range   Color, Urine ORANGE (A) YELLOW   APPearance CLEAR CLEAR   Specific Gravity, Urine >1.046 (H) 1.005 - 1.030   pH 7.0 5.0 - 8.0   Glucose, UA NEGATIVE NEGATIVE mg/dL   Hgb urine dipstick LARGE (A) NEGATIVE   Bilirubin Urine NEGATIVE NEGATIVE   Ketones, ur NEGATIVE NEGATIVE mg/dL   Protein, ur 30 (A) NEGATIVE mg/dL   Nitrite NEGATIVE NEGATIVE   Leukocytes,Ua TRACE (A) NEGATIVE   RBC / HPF >50 (H) 0 - 5 RBC/hpf   WBC, UA >50 (H) 0 - 5 WBC/hpf   Bacteria, UA RARE (A) NONE SEEN   Squamous Epithelial / LPF 0-5 0 - 5   Mucus PRESENT     Procedures  MDM  40 year old female presents to the ED due to acute appendicitis from [facility] emergency room.  Patient already given IV antibiotics prior to transport.   Patient taken to OR by general surgery with Dr. Sheliah Hatch.      Mannie Stabile, PA-C 08/11/21 0086    Milagros Loll, MD 08/11/21 1048

## 2021-08-11 NOTE — ED Notes (Signed)
Patient requesting medications for nausea and pain.  PA made aware.

## 2021-08-11 NOTE — Anesthesia Preprocedure Evaluation (Addendum)
Anesthesia Evaluation  Patient identified by MRN, date of birth, ID band Patient awake    Reviewed: Allergy & Precautions, H&P , NPO status , Patient's Chart, lab work & pertinent test results  Airway Mallampati: II  TM Distance: >3 FB Neck ROM: Full    Dental no notable dental hx. (+) Teeth Intact, Dental Advisory Given   Pulmonary neg pulmonary ROS,    Pulmonary exam normal breath sounds clear to auscultation       Cardiovascular negative cardio ROS   Rhythm:Regular Rate:Normal     Neuro/Psych negative neurological ROS  negative psych ROS   GI/Hepatic negative GI ROS, Neg liver ROS,   Endo/Other  negative endocrine ROS  Renal/GU negative Renal ROS  negative genitourinary   Musculoskeletal   Abdominal   Peds  Hematology negative hematology ROS (+)   Anesthesia Other Findings   Reproductive/Obstetrics negative OB ROS                            Anesthesia Physical Anesthesia Plan  ASA: 1  Anesthesia Plan: General   Post-op Pain Management: Ofirmev IV (intra-op)   Induction: Intravenous, Rapid sequence and Cricoid pressure planned  PONV Risk Score and Plan: 4 or greater and Ondansetron, Dexamethasone, Midazolam and Scopolamine patch - Pre-op  Airway Management Planned: Oral ETT  Additional Equipment:   Intra-op Plan:   Post-operative Plan: Extubation in OR  Informed Consent: I have reviewed the patients History and Physical, chart, labs and discussed the procedure including the risks, benefits and alternatives for the proposed anesthesia with the patient or authorized representative who has indicated his/her understanding and acceptance.     Dental advisory given  Plan Discussed with: CRNA  Anesthesia Plan Comments:        Anesthesia Quick Evaluation

## 2021-08-11 NOTE — Op Note (Signed)
Preoperative diagnosis: acute appendicitis with peritonitis  Postoperative diagnosis: Same   Procedure: laparoscopic appendectomy  Surgeon: Feliciana Rossetti, M.D.  Asst: none  Anesthesia: Gen.   Indications for procedure: Jodi Howard is a 40 y.o. female with symptoms of pain in right lower quadrant consistent with acute appendicitis. Confirmed by CT.  Description of procedure: The patient was brought into the operative suite, placed supine. Anesthesia was administered with endotracheal tube. The patient's left arm was tucked. All pressure points were offloaded by foam padding. The patient was prepped and draped in the usual sterile fashion.  A transverse incision was made to the left of the umbilicus and a 43mm trocar was [place]. Pneumoperitoneum was applied with high flow low pressure.  2 35mm trocars were placed, one in the suprapubic space, one in the LLQ, the periumbilical incision was then up-sized and a 9mm trocar placed in that space. A transversus abdominal block was placed on the left and right sides. Next, the patient was placed in trendelenberg, rotated to the left. The omentum was retracted cephalad. The cecum and appendix were identified. The appendix was dilated and inflamed without perforation. The base of the appendix was dissected and a window through the mesoappendix was created with blunt dissection. Large Hem-o-lock clips were used to doubly ligate the base of the appendix and mesoappendix. The appendix was cut free with scissors.  The appendix was placed in a specimen bag. The pelvis and RLQ were irrigated. No purulence was seen in either location The appendix was removed via the 12 mm trocar. 0 vicryl was used to close the fascial defect. Pneumoperitoneum was removed, all trocars were removed. All incisions were closed with 4-0 monocryl subcuticular stitch. The patient woke from anesthesia and was brought to PACU in stable condition.  Findings: acute appendicitis without  perforation  Specimen: appendix  Blood loss: 20 ml  Local anesthesia: 30 ml Marcaine  Complications: none  Images:      Feliciana Rossetti, M.D. General, Bariatric, & Minimally Invasive Surgery [facility]

## 2021-08-11 NOTE — ED Notes (Signed)
Patient arrived from [facility] at this time via [facility]

## 2021-08-11 NOTE — Anesthesia Postprocedure Evaluation (Signed)
Anesthesia Post Note  Patient: Jodi Howard  Procedure(s) Performed: APPENDECTOMY LAPAROSCOPIC     Patient location during evaluation: PACU Anesthesia Type: General Level of consciousness: awake and alert Pain management: pain level controlled Vital Signs Assessment: post-procedure vital signs reviewed and stable Respiratory status: spontaneous breathing, nonlabored ventilation and respiratory function stable Cardiovascular status: blood pressure returned to baseline and stable Postop Assessment: no apparent nausea or vomiting Anesthetic complications: no   No notable events documented.  Last Vitals:  Vitals:   08/11/21 1155 08/11/21 1208  BP: 112/68 103/66  Pulse: (!) 55 (!) 55  Resp: 16 15  Temp:  37.1 C  SpO2: 93% 93%    Last Pain:  Vitals:   08/11/21 1155  TempSrc:   PainSc: 6                  Khloe Hunkele,W. EDMOND

## 2021-08-11 NOTE — Anesthesia Procedure Notes (Signed)
Procedure Name: Intubation Date/Time: 08/11/2021 9:57 AM Performed by: Clearnce Sorrel, CRNA Pre-anesthesia Checklist: Patient identified, Emergency Drugs available, Suction available and Patient being monitored Patient Re-evaluated:Patient Re-evaluated prior to induction Oxygen Delivery Method: Circle System Utilized Preoxygenation: Pre-oxygenation with 100% oxygen Induction Type: IV induction, Rapid sequence and Cricoid Pressure applied Laryngoscope Size: Mac and 3 Grade View: Grade I Tube type: Oral Number of attempts: 1 Airway Equipment and Method: Stylet and Oral airway Placement Confirmation: ETT inserted through vocal cords under direct vision, positive ETCO2 and breath sounds checked- equal and bilateral Secured at: 23 cm Tube secured with: Tape Dental Injury: Teeth and Oropharynx as per pre-operative assessment

## 2021-08-11 NOTE — H&P (Signed)
Reason for Consult:abdominal pain Referring Provider: Luanna Howard Jodi Howard is an 40 y.o. female.  HPI: 40 yo female with   History reviewed. No pertinent past medical history.  Past Surgical History:  Procedure Laterality Date   AUGMENTATION MAMMAPLASTY     CESAREAN SECTION  04/24/2006   CESAREAN SECTION  06/02/2010    Family History  Problem Relation Age of Onset   Breast cancer Neg Hx     Social History:  reports that she has never smoked. She does not have any smokeless tobacco history on file. She reports current alcohol use. No history on file for drug use.  Allergies: No Known Allergies  Medications: I have reviewed the patient's current medications.  Results for orders placed or performed during the hospital encounter of 08/11/21 (from the past 48 hour(s))  Lipase, blood     Status: Abnormal   Collection Time: 08/11/21  3:43 AM  Result Value Ref Range   Lipase 337 (H) 11 - 51 U/L    Comment: Performed at [facility], [address], [place], [place] [address]  Comprehensive metabolic panel     Status: Abnormal   Collection Time: 08/11/21  3:43 AM  Result Value Ref Range   Sodium 136 135 - 145 mmol/L   Potassium 4.2 3.5 - 5.1 mmol/L   Chloride 103 98 - 111 mmol/L   CO2 26 22 - 32 mmol/L   Glucose, Bld 129 (H) 70 - 99 mg/dL    Comment: Glucose reference range applies only to samples taken after fasting for at least 8 hours.   BUN 11 6 - 20 mg/dL   Creatinine, Ser 2.75 0.44 - 1.00 mg/dL   Calcium 9.3 8.9 - 17.0 mg/dL   Total Protein 5.9 (L) 6.5 - 8.1 g/dL   Albumin 3.9 3.5 - 5.0 g/dL   AST 15 15 - 41 U/L   ALT 9 0 - 44 U/L   Alkaline Phosphatase 49 38 - 126 U/L   Total Bilirubin 0.7 0.3 - 1.2 mg/dL   GFR, Estimated >01 >74 mL/min    Comment: (NOTE) Calculated using the CKD-EPI Creatinine Equation (2021)    Anion gap 7 5 - 15    Comment: Performed at [facility], [address] [place], [place] [address]   CBC with Differential/Platelet     Status: None   Collection Time: 08/11/21  3:43 AM  Result Value Ref Range   WBC 8.8 4.0 - 10.5 K/uL   RBC 4.02 3.87 - 5.11 MIL/uL   Hemoglobin 12.5 12.0 - 15.0 g/dL   HCT 75.9 16.3 - 84.6 %   MCV 93.5 80.0 - 100.0 fL   MCH 31.1 26.0 - 34.0 pg   MCHC 33.2 30.0 - 36.0 g/dL   RDW 65.9 93.5 - 70.1 %   Platelets 220 150 - 400 K/uL   nRBC 0.0 0.0 - 0.2 %   Neutrophils Relative % 72 %   Neutro Abs 6.3 1.7 - 7.7 K/uL   Lymphocytes Relative 19 %   Lymphs Abs 1.7 0.7 - 4.0 K/uL   Monocytes Relative 8 %   Monocytes Absolute 0.7 0.1 - 1.0 K/uL   Eosinophils Relative 1 %   Eosinophils Absolute 0.1 0.0 - 0.5 K/uL   Basophils Relative 0 %   Basophils Absolute 0.0 0.0 - 0.1 K/uL   Immature Granulocytes 0 %   Abs Immature Granulocytes 0.02 0.00 - 0.07 K/uL    Comment: Performed at [facility], [address], [place], [place] [address]  hCG, serum, qualitative     Status: None   Collection Time: 08/11/21  4:21 AM  Result Value Ref Range   Preg, Serum NEGATIVE NEGATIVE    Comment:        THE SENSITIVITY OF THIS METHODOLOGY IS >10 mIU/mL. Performed at [facility], [address] [place], [place] [address]   Resp Panel by RT-PCR (Flu A&B, Covid) Nasopharyngeal Swab     Status: None   Collection Time: 08/11/21  5:51 AM   Specimen: Nasopharyngeal Swab; Nasopharyngeal(NP) swabs in vial transport medium  Result Value Ref Range   SARS Coronavirus 2 by RT PCR NEGATIVE NEGATIVE    Comment: (NOTE) SARS-CoV-2 target nucleic acids are NOT DETECTED.  The SARS-CoV-2 RNA is generally detectable in upper respiratory specimens during the acute phase of infection. The lowest concentration of SARS-CoV-2 viral copies this assay can detect is 138 copies/mL. A negative result does not preclude SARS-Cov-2 infection and should not be used as the sole basis for treatment or other patient management decisions. A negative result may  occur with  improper specimen collection/handling, submission of specimen other than nasopharyngeal swab, presence of viral mutation(s) within the areas targeted by this assay, and inadequate number of viral copies(<138 copies/mL). A negative result must be combined with clinical observations, patient history, and epidemiological information. The expected result is Negative.  Fact Sheet for Patients:  BloggerCourse.com  Fact Sheet for Healthcare Providers:  SeriousBroker.it  This test is no t yet approved or cleared by the [place] FDA and  has been authorized for detection and/or diagnosis of SARS-CoV-2 by FDA under an Emergency Use Authorization (EUA). This EUA will remain  in effect (meaning this test can be used) for the duration of the COVID-19 declaration under Section 564(b)(1) of the Act, 21 U.S.C.section 360bbb-3(b)(1), unless the authorization is terminated  or revoked sooner.       Influenza A by PCR NEGATIVE NEGATIVE   Influenza B by PCR NEGATIVE NEGATIVE    Comment: (NOTE) The Xpert Xpress SARS-CoV-2/FLU/RSV plus assay is intended as an aid in the diagnosis of influenza from Nasopharyngeal swab specimens and should not be used as a sole basis for treatment. Nasal washings and aspirates are unacceptable for Xpert Xpress SARS-CoV-2/FLU/RSV testing.  Fact Sheet for Patients: BloggerCourse.com  Fact Sheet for Healthcare Providers: SeriousBroker.it  This test is not yet approved or cleared by the [place] FDA and has been authorized for detection and/or diagnosis of SARS-CoV-2 by FDA under an Emergency Use Authorization (EUA). This EUA will remain in effect (meaning this test can be used) for the duration of the COVID-19 declaration under Section 564(b)(1) of the Act, 21 U.S.C. section 360bbb-3(b)(1), unless the authorization is terminated  or revoked.  Performed at [facility], [address] [place], [place] [address]   Urinalysis, Routine w reflex microscopic     Status: Abnormal   Collection Time: 08/11/21  6:00 AM  Result Value Ref Range   Color, Urine ORANGE (A) YELLOW    Comment: BIOCHEMICALS MAY BE AFFECTED BY COLOR   APPearance CLEAR CLEAR   Specific Gravity, Urine >1.046 (H) 1.005 - 1.030   pH 7.0 5.0 - 8.0   Glucose, UA NEGATIVE NEGATIVE mg/dL   Hgb urine dipstick LARGE (A) NEGATIVE   Bilirubin Urine NEGATIVE NEGATIVE   Ketones, ur NEGATIVE NEGATIVE mg/dL   Protein, ur 30 (A) NEGATIVE mg/dL   Nitrite NEGATIVE NEGATIVE   Leukocytes,Ua TRACE (A)  NEGATIVE   RBC / HPF >50 (H) 0 - 5 RBC/hpf   WBC, UA >50 (H) 0 - 5 WBC/hpf   Bacteria, UA RARE (A) NONE SEEN   Squamous Epithelial / LPF 0-5 0 - 5   Mucus PRESENT     Comment: Performed at [facility], [address], [place], [place] [address]    CT ABDOMEN PELVIS W CONTRAST  Result Date: 08/11/2021 CLINICAL DATA:  Right lower quadrant abdominal pain EXAM: CT ABDOMEN AND PELVIS WITH CONTRAST TECHNIQUE: Multidetector CT imaging of the abdomen and pelvis was performed using the standard protocol following bolus administration of intravenous contrast. CONTRAST:  OMNIPAQUE IOHEXOL 300 MG/ML  SOLN COMPARISON:  None. FINDINGS: Lower chest:  Mild atelectasis. Hepatobiliary: No focal liver abnormality.No evidence of biliary obstruction or stone. Pancreas: Unremarkable. Spleen: Unremarkable. Adrenals/Urinary Tract: Negative adrenals. No hydronephrosis or stone. Unremarkable bladder. Stomach/Bowel: Typical positioning of the appendix in the right lower quadrant below the cecum with luminal distension and mesenteric fat edema. Outer wall diameter is 11 mm. Two appendicoliths are present. No perforation or abscess. Stool distended ascending colon but no generalized constipated appearance. Vascular/Lymphatic: No acute vascular  abnormality. No mass or adenopathy. Reproductive:No pathologic findings. Other: Trace pelvic fluid considered reactive.  No loculated fluid. Musculoskeletal: No acute abnormalities. IMPRESSION: Acute appendicitis without perforation.  Appendicoliths are present. Electronically Signed   By: Tiburcio Pea M.D.   On: 08/11/2021 05:46    Review of Systems  Constitutional: Negative.   HENT: Negative.    Eyes: Negative.   Respiratory: Negative.    Cardiovascular: Negative.   Gastrointestinal:  Positive for abdominal pain and nausea.  Genitourinary: Negative.   Musculoskeletal: Negative.   Skin: Negative.   Neurological: Negative.   Endo/Heme/Allergies: Negative.   Psychiatric/Behavioral: Negative.     PE Blood pressure 109/66, pulse (!) 56, temperature 98.3 F (36.8 C), temperature source Oral, resp. rate 18, height 5\' 8"  (1.727 m), weight 73 kg, last menstrual period 07/21/2021, SpO2 97 %. Constitutional: NAD; conversant; no deformities Eyes: Moist conjunctiva; no lid lag; anicteric; PERRL Neck: Trachea midline; no thyromegaly Lungs: Normal respiratory effort; no tactile fremitus CV: RRR; no palpable thrills; no pitting edema GI: Abd tenderness to palpation RLQ; no palpable hepatosplenomegaly MSK: Normal gait; no clubbing/cyanosis Psychiatric: Appropriate affect; alert and oriented x3 Lymphatic: No palpable cervical or axillary lymphadenopathy Skin: No major subcutaneous nodules. Warm and dry   Assessment/Plan: 40 yo female with acute appendicitis -IV abx -plan for OR today -we discussed the details of the procedure; that it would be done under general anesthesia, that we would attempt to do the procedure laparoscopically. That the appendix would be isolated from the large and small intestine and then ligated and removed. We discussed the reason for this is to avoid rupture and infection and resolve the pains. We discussed risks of infection, abscess, injury to intestines or urinary  structures, and need for open incision. She showed good understanding and wanted to proceed.   46 Jodi Howard 08/11/2021, 9:15 AM

## 2021-08-11 NOTE — ED Notes (Signed)
Pt gone to CT 

## 2021-08-11 NOTE — Discharge Summary (Signed)
Physician Discharge Summary  Jodi Howard WKG:[id number] DOB: 1981-07-31 DOA: 08/11/2021  PCP: Kandyce Rud, MD  Admit date: 08/11/2021 Discharge date: 08/11/2021  Recommendations for Outpatient Follow-up:  none (include homehealth, outpatient follow-up instructions, specific recommendations for PCP to follow-up on, etc.)   Discharge Diagnoses:  Active Problems:   * No active hospital problems. *   Surgical Procedure: lap appendectomy  Discharge Condition: Good Disposition: Home  Diet recommendation: reg diet   Hospital Course:  40 yo female presented to ED with acute appendicitis. She underwent lap appendectomy and was discharged home in PACU.  Discharge Instructions  Discharge Instructions     Call MD for:  difficulty breathing, headache or visual disturbances   Complete by: As directed    Call MD for:  hives   Complete by: As directed    Call MD for:  persistant nausea and vomiting   Complete by: As directed    Call MD for:  redness, tenderness, or signs of infection (pain, swelling, redness, odor or green/yellow discharge around incision site)   Complete by: As directed    Call MD for:  severe uncontrolled pain   Complete by: As directed    Call MD for:  temperature >100.4   Complete by: As directed    Diet - low sodium heart healthy   Complete by: As directed    Discharge wound care:   Complete by: As directed    Ok to shower tomorrow. Glue will likely peel off in 1-3 weeks. No bandage required   Driving Restrictions   Complete by: As directed    No driving while on narcotics   Increase activity slowly   Complete by: As directed    Lifting restrictions   Complete by: As directed    No lifting greater than 20 pounds for 3 weeks      Allergies as of 08/11/2021   No Known Allergies      Medication List     STOP taking these medications    sulfamethoxazole-trimethoprim 800-160 MG tablet Commonly known as: BACTRIM DS       TAKE  these medications    amphetamine-dextroamphetamine 30 MG 24 hr capsule Commonly known as: ADDERALL XR Take 30 mg by mouth daily.   FLINTSTONES COMPLETE PO Take 2 tablets by mouth daily.   FLUoxetine 40 MG capsule Commonly known as: PROZAC Take 40 mg by mouth at bedtime.   ibuprofen 800 MG tablet Commonly known as: ADVIL Take 1 tablet (800 mg total) by mouth every 8 (eight) hours as needed. What changed:  medication strength how much to take when to take this reasons to take this   oxyCODONE 5 MG immediate release tablet Commonly known as: Oxy IR/ROXICODONE Take 1 tablet (5 mg total) by mouth every 6 (six) hours as needed for severe pain.   PROBIOTIC DAILY PO Take 1 capsule by mouth daily.   tretinoin 0.1 % cream Commonly known as: RETIN-A Apply 1 application topically at bedtime as needed (breakouts).               Discharge Care Instructions  (From admission, onward)           Start     Ordered   08/11/21 0000  Discharge wound care:       Comments: Ok to shower tomorrow. Glue will likely peel off in 1-3 weeks. No bandage required   08/11/21 1046              The results  of significant diagnostics from this hospitalization (including imaging, microbiology, ancillary and laboratory) are listed below for reference.    Significant Diagnostic Studies: CT ABDOMEN PELVIS W CONTRAST  Result Date: 08/11/2021 CLINICAL DATA:  Right lower quadrant abdominal pain EXAM: CT ABDOMEN AND PELVIS WITH CONTRAST TECHNIQUE: Multidetector CT imaging of the abdomen and pelvis was performed using the standard protocol following bolus administration of intravenous contrast. CONTRAST:  OMNIPAQUE IOHEXOL 300 MG/ML  SOLN COMPARISON:  None. FINDINGS: Lower chest:  Mild atelectasis. Hepatobiliary: No focal liver abnormality.No evidence of biliary obstruction or stone. Pancreas: Unremarkable. Spleen: Unremarkable. Adrenals/Urinary Tract: Negative adrenals. No hydronephrosis  or stone. Unremarkable bladder. Stomach/Bowel: Typical positioning of the appendix in the right lower quadrant below the cecum with luminal distension and mesenteric fat edema. Outer wall diameter is 11 mm. Two appendicoliths are present. No perforation or abscess. Stool distended ascending colon but no generalized constipated appearance. Vascular/Lymphatic: No acute vascular abnormality. No mass or adenopathy. Reproductive:No pathologic findings. Other: Trace pelvic fluid considered reactive.  No loculated fluid. Musculoskeletal: No acute abnormalities. IMPRESSION: Acute appendicitis without perforation.  Appendicoliths are present. Electronically Signed   By: Tiburcio Pea M.D.   On: 08/11/2021 05:46    Labs: Basic Metabolic Panel: Recent Labs  Lab 08/11/21 0343  NA 136  K 4.2  CL 103  CO2 26  GLUCOSE 129*  BUN 11  CREATININE 0.68  CALCIUM 9.3   Liver Function Tests: Recent Labs  Lab 08/11/21 0343  AST 15  ALT 9  ALKPHOS 49  BILITOT 0.7  PROT 5.9*  ALBUMIN 3.9    CBC: Recent Labs  Lab 08/11/21 0343  WBC 8.8  NEUTROABS 6.3  HGB 12.5  HCT 37.6  MCV 93.5  PLT 220    CBG: No results for input(s): GLUCAP in the last 168 hours.  Active Problems:   * No active hospital problems. *   Time coordinating discharge: 15 min

## 2021-08-11 NOTE — ED Provider Notes (Signed)
[facility] Provider Note: Jodi Dell, MD, FACEP  CSN: [id number] MRN: [id number] ARRIVAL: 08/11/21 at 0332 ROOM: DB013/DB013   CHIEF COMPLAINT  Abdominal Pain   HISTORY OF PRESENT ILLNESS  08/11/21 3:41 AM Jodi Howard is a 40 y.o. female who developed right lower quadrant abdominal pain yesterday evening about 8 PM.  The onset was fairly sudden.  The pain is well localized and fairly sharp in the right lower quadrant but she feels a sense of bloating throughout her entire abdomen.  Pain is worse with movement or palpation.  She is having nausea with it but no vomiting or diarrhea.  She denies vaginal bleeding or discharge.  She denies dysuria or hematuria.  She has not had a fever.   History reviewed. No pertinent past medical history.  Past Surgical History:  Procedure Laterality Date   AUGMENTATION MAMMAPLASTY     CESAREAN SECTION  04/24/2006   CESAREAN SECTION  06/02/2010    Family History  Problem Relation Age of Onset   Breast cancer Neg Hx     Social History   Tobacco Use   Smoking status: Never  Substance Use Topics   Alcohol use: Yes    Prior to Admission medications   Medication Sig Start Date End Date Taking? Authorizing Provider  amphetamine-dextroamphetamine (ADDERALL) 20 MG tablet Take 20 mg by mouth daily with lunch.   Yes [provider]  amphetamine-dextroamphetamine (ADDERALL) 30 MG tablet Take 30 mg by mouth every morning.   Yes [provider]  FLUoxetine (PROZAC) 40 MG capsule Take by mouth. 07/19/21  Yes [provider]    Allergies Patient has no known allergies.   REVIEW OF SYSTEMS  Negative except as noted here or in the History of Present Illness.   PHYSICAL EXAMINATION  Initial Vital Signs Blood pressure 137/86, pulse (!) 56, temperature 98.5 F (36.9 C), temperature source Oral, resp. rate 16, height 5\' 8"  (1.727 m), weight 73 kg, last menstrual period 07/21/2021, SpO2 100  %.  Examination General: Well-developed, well-nourished female in no acute distress; appearance consistent with age of record HENT: normocephalic; atraumatic Eyes: Normal appearance Neck: supple Heart: regular rate and rhythm Lungs: clear to auscultation bilaterally Abdomen: soft; nondistended; right lower quadrant tenderness; bowel sounds present Extremities: No deformity; full range of motion; pulses normal Neurologic: Awake, alert and oriented; motor function intact in all extremities and symmetric; no facial droop Skin: Warm and dry Psychiatric: Flat affect   RESULTS  Summary of this visit's results, reviewed and interpreted by myself:   EKG Interpretation  Date/Time:    Ventricular Rate:    PR Interval:    QRS Duration:   QT Interval:    QTC Calculation:   R Axis:     Text Interpretation:         Laboratory Studies: Results for orders placed or performed during the hospital encounter of 08/11/21 (from the past 24 hour(s))  Lipase, blood     Status: Abnormal   Collection Time: 08/11/21  3:43 AM  Result Value Ref Range   Lipase 337 (H) 11 - 51 U/L  Comprehensive metabolic panel     Status: Abnormal   Collection Time: 08/11/21  3:43 AM  Result Value Ref Range   Sodium 136 135 - 145 mmol/L   Potassium 4.2 3.5 - 5.1 mmol/L   Chloride 103 98 - 111 mmol/L   CO2 26 22 - 32 mmol/L   Glucose, Bld 129 (H) 70 - 99 mg/dL  BUN 11 6 - 20 mg/dL   Creatinine, Ser 0.68 0.44 - 1.00 mg/dL   Calcium 9.3 8.9 - 10.3 mg/dL   Total Protein 5.9 (L) 6.5 - 8.1 g/dL   Albumin 3.9 3.5 - 5.0 g/dL   AST 15 15 - 41 U/L   ALT 9 0 - 44 U/L   Alkaline Phosphatase 49 38 - 126 U/L   Total Bilirubin 0.7 0.3 - 1.2 mg/dL   GFR, Estimated >60 >60 mL/min   Anion gap 7 5 - 15  CBC with Differential/Platelet     Status: None   Collection Time: 08/11/21  3:43 AM  Result Value Ref Range   WBC 8.8 4.0 - 10.5 K/uL   RBC 4.02 3.87 - 5.11 MIL/uL   Hemoglobin 12.5 12.0 - 15.0 g/dL   HCT 37.6 36.0 -  46.0 %   MCV 93.5 80.0 - 100.0 fL   MCH 31.1 26.0 - 34.0 pg   MCHC 33.2 30.0 - 36.0 g/dL   RDW 12.5 11.5 - 15.5 %   Platelets 220 150 - 400 K/uL   nRBC 0.0 0.0 - 0.2 %   Neutrophils Relative % 72 %   Neutro Abs 6.3 1.7 - 7.7 K/uL   Lymphocytes Relative 19 %   Lymphs Abs 1.7 0.7 - 4.0 K/uL   Monocytes Relative 8 %   Monocytes Absolute 0.7 0.1 - 1.0 K/uL   Eosinophils Relative 1 %   Eosinophils Absolute 0.1 0.0 - 0.5 K/uL   Basophils Relative 0 %   Basophils Absolute 0.0 0.0 - 0.1 K/uL   Immature Granulocytes 0 %   Abs Immature Granulocytes 0.02 0.00 - 0.07 K/uL  hCG, serum, qualitative     Status: None   Collection Time: 08/11/21  4:21 AM  Result Value Ref Range   Preg, Serum NEGATIVE NEGATIVE   Imaging Studies: CT ABDOMEN PELVIS W CONTRAST  Result Date: 08/11/2021 CLINICAL DATA:  Right lower quadrant abdominal pain EXAM: CT ABDOMEN AND PELVIS WITH CONTRAST TECHNIQUE: Multidetector CT imaging of the abdomen and pelvis was performed using the standard protocol following bolus administration of intravenous contrast. CONTRAST:  175mL OMNIPAQUE IOHEXOL 300 MG/ML  SOLN COMPARISON:  None. FINDINGS: Lower chest:  Mild atelectasis. Hepatobiliary: No focal liver abnormality.No evidence of biliary obstruction or stone. Pancreas: Unremarkable. Spleen: Unremarkable. Adrenals/Urinary Tract: Negative adrenals. No hydronephrosis or stone. Unremarkable bladder. Stomach/Bowel: Typical positioning of the appendix in the right lower quadrant below the cecum with luminal distension and mesenteric fat edema. Outer wall diameter is 11 mm. Two appendicoliths are present. No perforation or abscess. Stool distended ascending colon but no generalized constipated appearance. Vascular/Lymphatic: No acute vascular abnormality. No mass or adenopathy. Reproductive:No pathologic findings. Other: Trace pelvic fluid considered reactive.  No loculated fluid. Musculoskeletal: No acute abnormalities. IMPRESSION: Acute  appendicitis without perforation.  Appendicoliths are present. Electronically Signed   By: Jorje Guild M.D.   On: 08/11/2021 05:46    ED COURSE and MDM  Nursing notes, initial and subsequent vitals signs, including pulse oximetry, reviewed and interpreted by myself.  Vitals:   08/11/21 0415 08/11/21 0430 08/11/21 0500 08/11/21 0535  BP: 118/63 113/68 120/74 122/73  Pulse: (!) 52 (!) 51 (!) 51 (!) 54  Resp: 16 18 18 18   Temp:      TempSrc:      SpO2: 96% 95% 96% 98%  Weight:      Height:       Medications  cefTRIAXone (ROCEPHIN) 2 g in sodium chloride 0.9 %  100 mL IVPB (2 g Intravenous New Bag/Given 08/11/21 0559)    And  metroNIDAZOLE (FLAGYL) IVPB 500 mg (has no administration in time range)  fentaNYL (SUBLIMAZE) injection 50 mcg (has no administration in time range)  ondansetron (ZOFRAN) injection 4 mg (4 mg Intravenous Given 08/11/21 0354)  fentaNYL (SUBLIMAZE) injection 100 mcg (100 mcg Intravenous Given 08/11/21 0354)  iohexol (OMNIPAQUE) 300 MG/ML solution 100 mL (100 mLs Intravenous Contrast Given 08/11/21 0509)  fentaNYL (SUBLIMAZE) injection 50 mcg (50 mcg Intravenous Given 08/11/21 0535)   5:53 AM Antibiotics initiated for acute appendicitis.  The cause of her elevated lipase is unclear.  No evidence of pancreatitis on CT.  5:58 AM Discussed with Dr. Thermon Leyland of general surgery.  He requests we send the patient to the [facility] ED. Dr. Sedonia Small accepting EDP.    PROCEDURES  Procedures CRITICAL CARE Performed by: Karen Chafe Wednesday Ericsson Total critical care time: 30 minutes Critical care time was exclusive of separately billable procedures and treating other patients. Critical care was necessary to treat or prevent imminent or life-threatening deterioration. Critical care was time spent personally by me on the following activities: development of treatment plan with patient and/or surrogate as well as nursing, discussions with consultants, evaluation of patient's response  to treatment, examination of patient, obtaining history from patient or surrogate, ordering and performing treatments and interventions, ordering and review of laboratory studies, ordering and review of radiographic studies, pulse oximetry and re-evaluation of patient's condition.   ED DIAGNOSES     ICD-10-CM   1. Acute appendicitis with localized peritonitis, without perforation, abscess, or gangrene  K35.30     2. Elevated lipase  R74.8          Kazia Grisanti, Jenny Reichmann, MD 08/11/21 [phone]

## 2021-08-11 NOTE — ED Notes (Signed)
ED Provider at bedside. 

## 2021-08-11 NOTE — ED Triage Notes (Signed)
Right lower abdominal pain, bloating, nausea. Pain started Friday.

## 2021-08-12 ENCOUNTER — Encounter (HOSPITAL_COMMUNITY): Payer: Self-pay | Admitting: General Surgery

## 2021-08-15 LAB — SURGICAL PATHOLOGY

## 2022-06-27 ENCOUNTER — Other Ambulatory Visit: Payer: Self-pay | Admitting: Obstetrics and Gynecology

## 2022-06-27 DIAGNOSIS — Z1231 Encounter for screening mammogram for malignant neoplasm of breast: Secondary | ICD-10-CM

## 2022-08-12 ENCOUNTER — Ambulatory Visit: Payer: BC Managed Care – PPO

## 2022-10-03 ENCOUNTER — Ambulatory Visit: Payer: BC Managed Care – PPO

## 2023-03-31 IMAGING — CT CT ABD-PELV W/ CM
2 of 5 series · 17 of 46 positions shown, 19 images · IV contrast (APPLIED)
Comparison: None.

CLINICAL DATA: Right lower quadrant abdominal pain

EXAM:
CT ABDOMEN AND PELVIS WITH CONTRAST
TECHNIQUE: Multidetector CT imaging of the abdomen and pelvis was performed
using the standard protocol following bolus administration of
intravenous contrast.
CONTRAST:  100mL OMNIPAQUE IOHEXOL 300 MG/ML  SOLN

[Series 2: abd pel w · axial · 0.74mm/px · z∈[+755,+1180]mm · 14 of 97 slices shown, 16 images]
[im 6/97  soft-tissue]
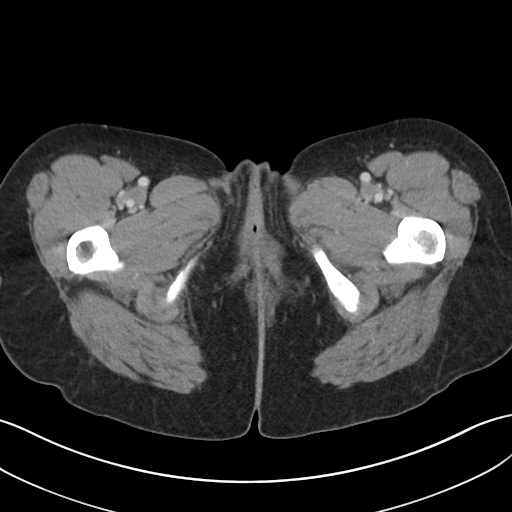
[im 6/97  bone]
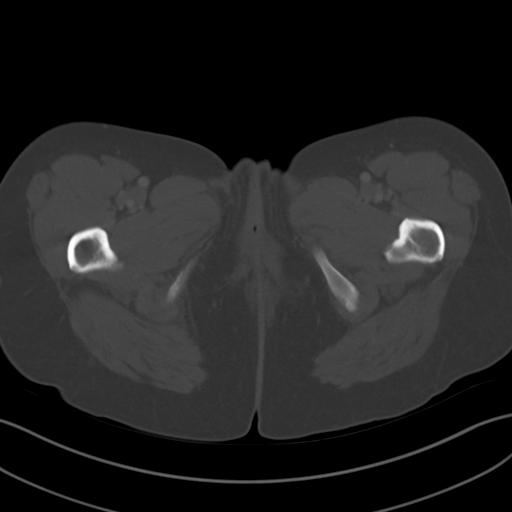
[im 11/97  soft-tissue]
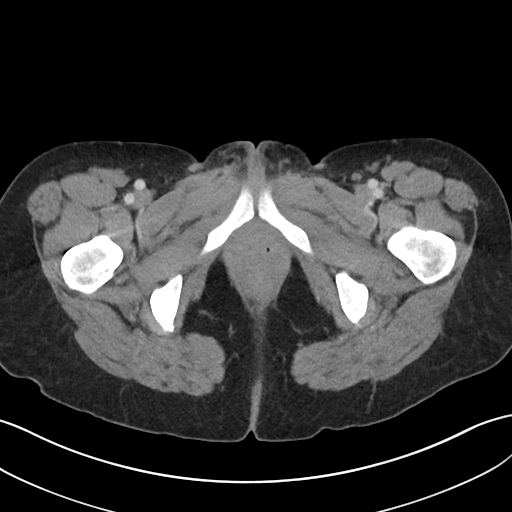
[im 22/97  soft-tissue]
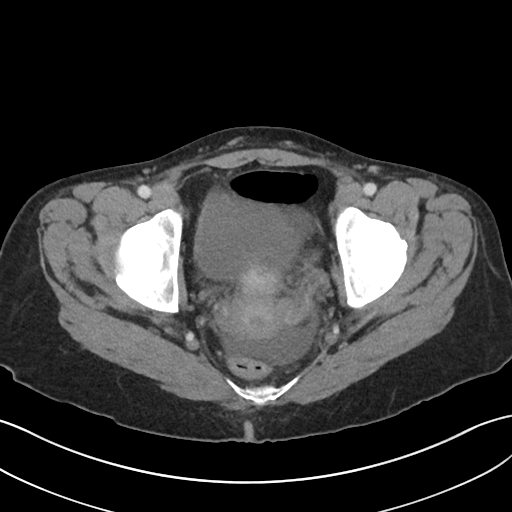
[im 27/97  soft-tissue]
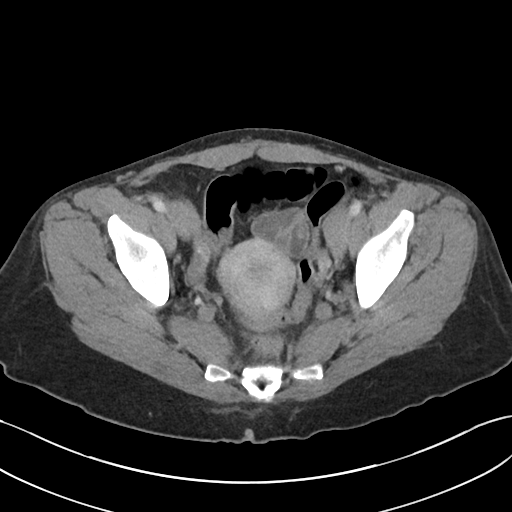
[im 33/97  soft-tissue]
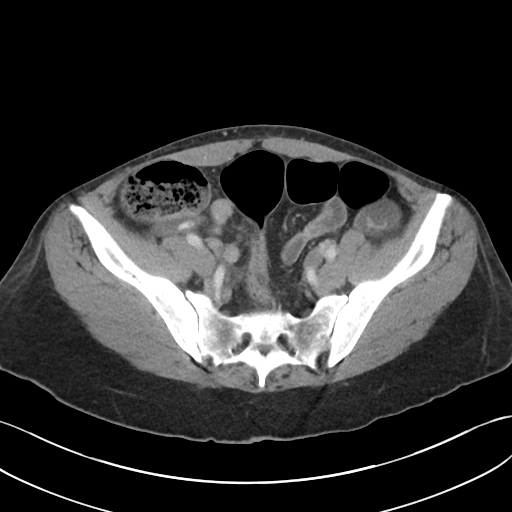
[im 38/97  soft-tissue]
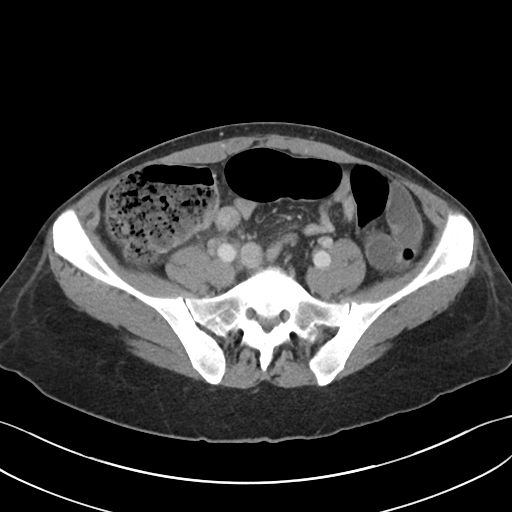
[im 43/97  soft-tissue]
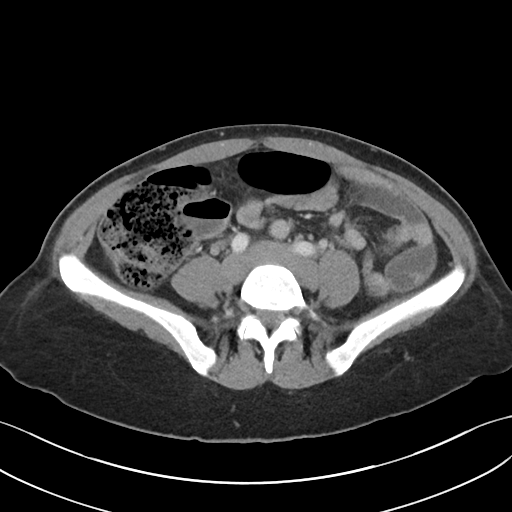
[im 54/97  soft-tissue]
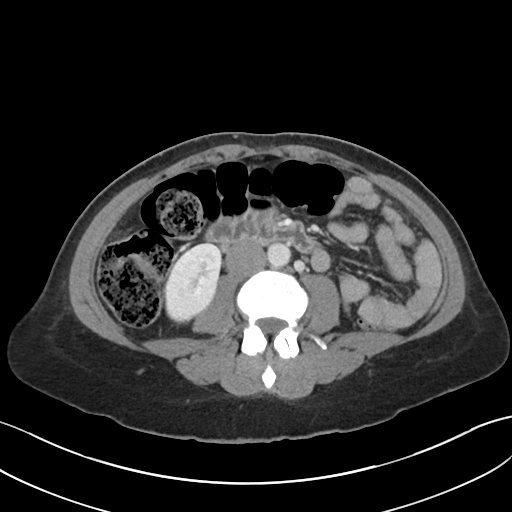
[im 59/97  soft-tissue]
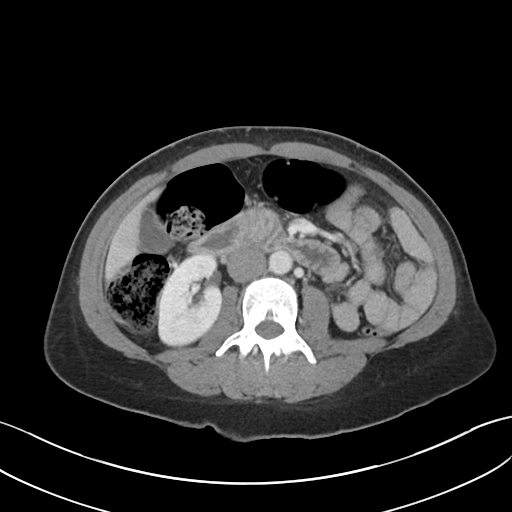
[im 59/97  bone]
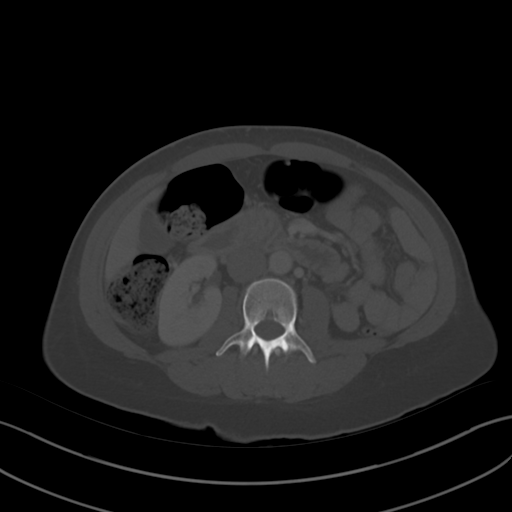
[im 65/97  soft-tissue]
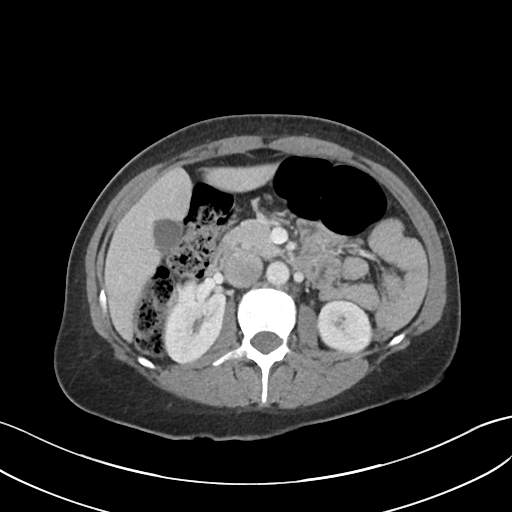
[im 70/97  soft-tissue]
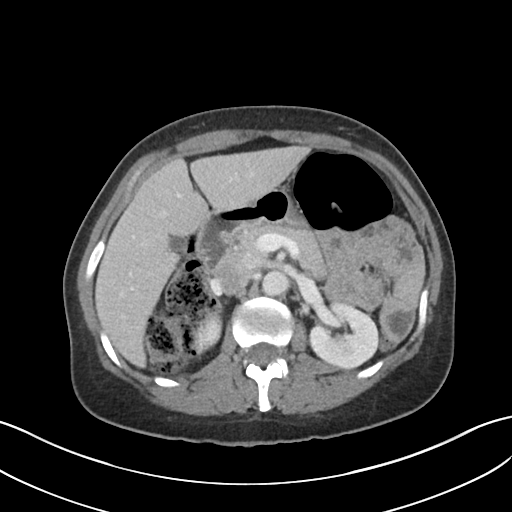
[im 75/97  soft-tissue]
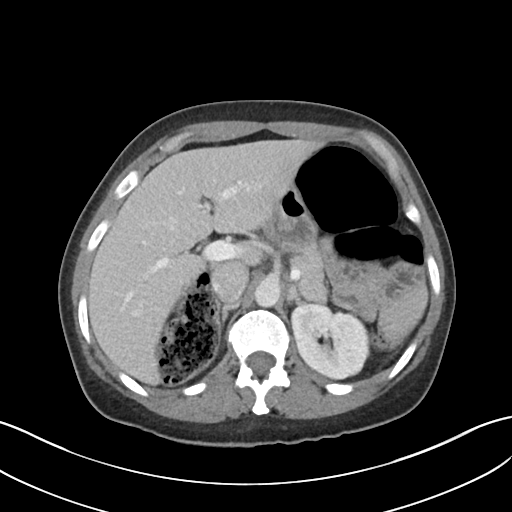
[im 86/97  soft-tissue]
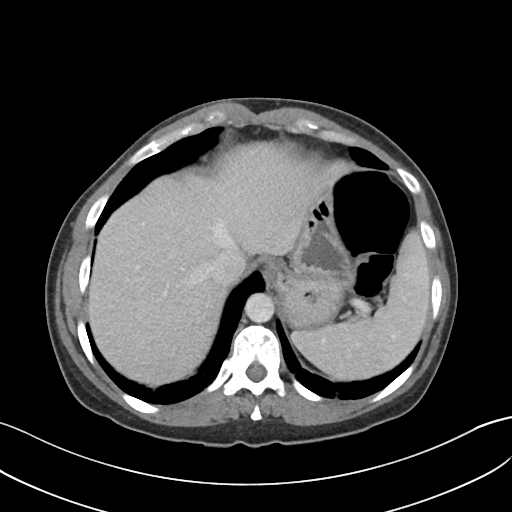
[im 91/97  soft-tissue]
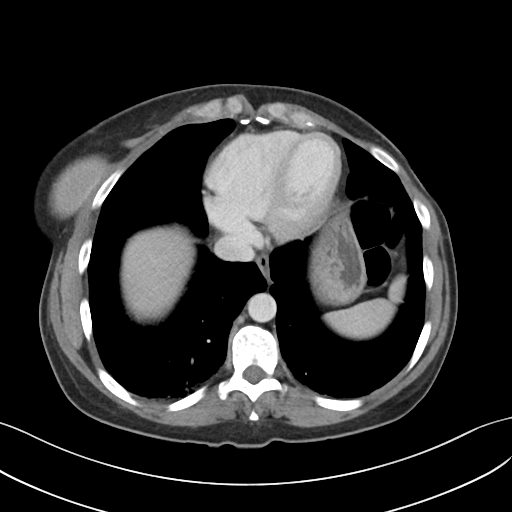

[Series 5: coronal · coronal · 0.88mm/px · 3 of 101 slices shown]
[im 34/101  soft-tissue]
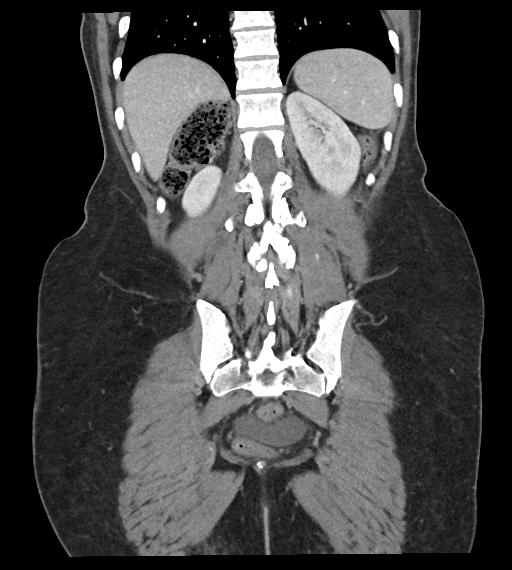
[im 45/101  soft-tissue]
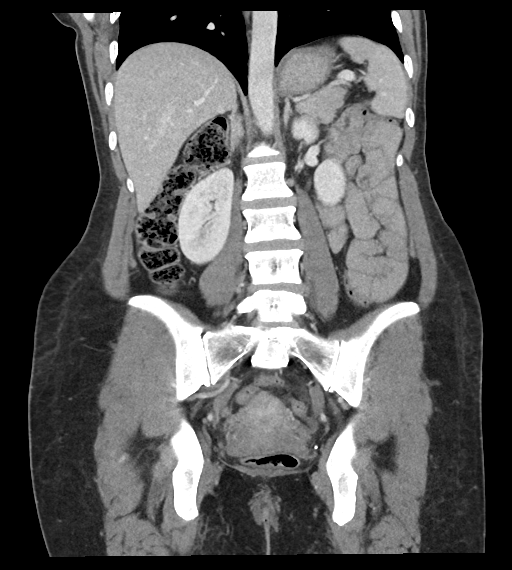
[im 56/101  soft-tissue]
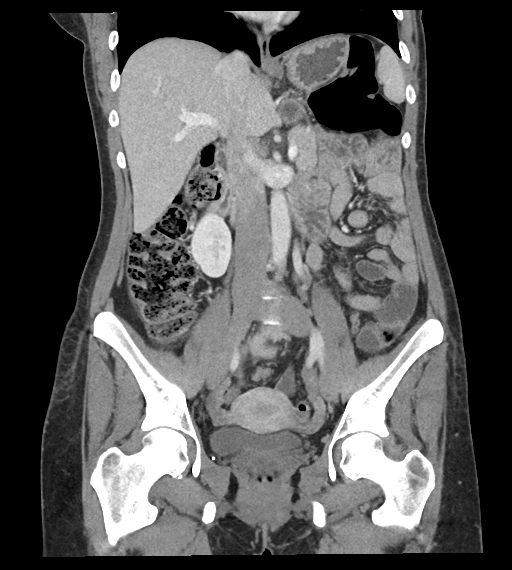

[17 of 46 positions shown; findings below may reference images not displayed]

FINDINGS: Lower chest:  Mild atelectasis.

Hepatobiliary: No focal liver abnormality.No evidence of biliary
obstruction or stone.

Pancreas: Unremarkable.

Spleen: Unremarkable.

Adrenals/Urinary Tract: Negative adrenals. No hydronephrosis or
stone. Unremarkable bladder.

Stomach/Bowel: Typical positioning of the appendix in the right
lower quadrant below the cecum with luminal distension and
mesenteric fat edema. Outer wall diameter is 11 mm. Two
appendicoliths are present. No perforation or abscess. Stool
distended ascending colon but no generalized constipated appearance.

Vascular/Lymphatic: No acute vascular abnormality. No mass or
adenopathy.

Reproductive:No pathologic findings.

Other: Trace pelvic fluid considered reactive.  No loculated fluid.

Musculoskeletal: No acute abnormalities.
IMPRESSION: Acute appendicitis without perforation.  Appendicoliths are present.

## 2024-10-08 ENCOUNTER — Telehealth: Payer: Self-pay | Admitting: *Deleted

## 2024-10-08 NOTE — Telephone Encounter (Signed)
 Returned call from 10/07/2024 at 2:28 PM. Left patient a detailed message with scheduled appointment.

## 2024-11-15 ENCOUNTER — Encounter: Admitting: Obstetrics & Gynecology
